# Patient Record
Sex: Female | Born: 1940 | Race: Black or African American | Hispanic: No | Smoking: Current every day smoker
Health system: Southern US, Community
[De-identification: ages and names within clinical notes are randomized; demographics above are authoritative.]

## PROBLEM LIST (undated history)

## (undated) DIAGNOSIS — I10 Essential (primary) hypertension: Secondary | ICD-10-CM

## (undated) DIAGNOSIS — E7800 Pure hypercholesterolemia, unspecified: Secondary | ICD-10-CM

## (undated) DIAGNOSIS — C801 Malignant (primary) neoplasm, unspecified: Secondary | ICD-10-CM

## (undated) DIAGNOSIS — F0390 Unspecified dementia without behavioral disturbance: Secondary | ICD-10-CM

## (undated) HISTORY — PX: ABDOMINAL HYSTERECTOMY: SHX81

## (undated) HISTORY — PX: BREAST SURGERY: SHX581

## (undated) HISTORY — PX: THYROIDECTOMY: SHX17

---

## 2001-04-15 ENCOUNTER — Encounter (INDEPENDENT_AMBULATORY_CARE_PROVIDER_SITE_OTHER): Payer: Self-pay | Admitting: *Deleted

## 2001-04-15 ENCOUNTER — Observation Stay (HOSPITAL_COMMUNITY): Admission: RE | Admit: 2001-04-15 | Discharge: 2001-04-16 | Payer: Self-pay | Admitting: Orthopedic Surgery

## 2002-03-02 ENCOUNTER — Other Ambulatory Visit: Admission: RE | Admit: 2002-03-02 | Discharge: 2002-03-02 | Payer: Self-pay | Admitting: *Deleted

## 2002-04-08 ENCOUNTER — Ambulatory Visit: Admission: RE | Admit: 2002-04-08 | Discharge: 2002-06-18 | Payer: Self-pay | Admitting: Unknown Physician Specialty

## 2004-09-06 ENCOUNTER — Ambulatory Visit: Payer: Self-pay | Admitting: Oncology

## 2005-01-02 ENCOUNTER — Ambulatory Visit: Payer: Self-pay | Admitting: Oncology

## 2005-05-08 ENCOUNTER — Ambulatory Visit: Payer: Self-pay | Admitting: Oncology

## 2005-08-28 ENCOUNTER — Ambulatory Visit: Payer: Self-pay | Admitting: Oncology

## 2006-01-01 ENCOUNTER — Ambulatory Visit: Payer: Self-pay | Admitting: Oncology

## 2006-06-18 ENCOUNTER — Ambulatory Visit: Payer: Self-pay | Admitting: Oncology

## 2007-01-07 ENCOUNTER — Ambulatory Visit: Payer: Self-pay | Admitting: Oncology

## 2010-12-28 NOTE — Op Note (Signed)
[facility]  Patient:    April [name], April [name] Visit Number: [id number] MRN: [id number]          Service Type: SUR Location: 4W [id number] Attending Physician:  Loanne Drilling Proc. Date: 04/15/01 Admit Date:  04/15/2001                             Operative Report  PREOPERATIVE DIAGNOSES:  Left shoulder impingement, acromioclavicular arthrosis, rotator cuff tear, and anterior soft tissue mass left upper arm.  POSTOPERATIVE DIAGNOSES:  Left shoulder impingement, acromioclavicular arthrosis, rotator cuff tear, and anterior soft tissue mass left upper arm.  PROCEDURES: 1. Left shoulder subacromial decompression, distal clavicle resection, and    rotator cuff repair. 2. Excision of an anterior soft tissue mass, left upper arm.  SURGEON:  Ollen Gross, M.D.  ASSISTANT:  Della Goo, P.A.-C.  ANESTHESIA:  General.  ESTIMATED BLOOD LOSS:  Minimal.  DRAINS:  None.  COMPLICATIONS:  None.  CONDITION:  Stable to recovery.  BRIEF CLINICAL NOTE:  April [name] is a 70 year old female with severe left shoulder pain and dysfunction.  MRI demonstrated rotator cuff tear.  She had large spurs.  She presents now for the above-mentioned procedure.  She also has an uncomfortable soft tissue mass in the left upper arm which is to be removed today.  DESCRIPTION OF PROCEDURE:  After the successful administration of intrascalene block, then general anesthetic, the patient is placed sitting upright in the beach chair position, and her left upper extremity and shoulder girdle isolated from her trunk with plastic drapes and prepped and draped in the usual sterile fashion.  Incision is then made, following the skin lines in the mid acromion from posterior to anterior.  Skin is cut with a 10 blade through subcutaneous tissue to a level of deltoid fascia, and circumferential flaps are elevated.  The fascia over the distal clavicle is split longitudinally, and the line is  taken across the anterior aspect of the acromion and the deltoid split in line with its fibers for about 2 cm.  The entire anterior soft tissue sleeve is subperiosteally elevated and posteriorly chest elevated over the distal clavicle.  About 1 cm of distal clavicle is resected with an oscillating saw.  She had a type 2 acromion, and the oscillating saw is then used to create a flat acromial undersurface by removing the spur.  Rotator cuff is visualized after the bursa is removed.  She has a high-grade partial thickness tear at the supraspinatus insertion.  This is taken down, small trough created, and the Mitek anchor passed into the trough.  The sutures are passed through the free edge of the tendon, and it is advanced to the trough and the sutures tied.  The wound is then copiously irrigated with antibiotic solution and the deltoid reattached to the acromion through drill holes.  Fascia over the clavicle is then embrocated with #1 Ethibond and the deltoid splits also closed with the Ethibond.  Subcu is closed with interrupted 2-0 Vicryl.  Subcuticular with running 4-0 Monocryl.  A longitudinal incision is then made over the anterior soft tissue mass about 4 cm in length.  The skin is cut with a 10 blade through subcutaneous tissue, and then the mass is identified and dissected and removed.  It was consistent with a lipoma.  The bleeding stopped with electrocautery and subcutaneous tissue was closed with interrupted 2-0 Vicryl after a thorough irrigation.  The subcuticular  is closed with running 4-0 Monocryl.  Incision is clean and dry and Steri-Strips and a bulky sterile dressing applied.  She is placed into a shoulder immobilizer, awakened, and transported to recovery in stable condition. Attending Physician:  Loanne Drilling DD:  04/15/01 TD:  04/15/01 Job: [id number]

## 2010-12-28 NOTE — H&P (Signed)
[facility]  Patient:    April [name]. Visit Number: [id number] MRN: [id number]          Service Type: Attending:  Trudee Grip, M.D. Dictated by:   Druscilla Brownie. Shela Nevin, P.A.-C   CC:         Ollen Gross, M.D.  Nadine Counts, M.D. in [place], [place]   History and Physical  DATE OF BIRTH:  [birth year].  SCHEDULED DATE OF ADMISSION:  April 15, 2001.  CHIEF COMPLAINT:  Pain in my left shoulder.  PLANNED PROCEDURE:  Decompression and distal clavicle resection with rotator cuff repair and removal of anterior soft tissue mass of the left anterior chest wall.  HISTORY OF PRESENT ILLNESS:   This 70 year old female has been seen by Dr. Despina Hick for continuing progressive problem in turning her left shoulder. She has had problems with this at least for three to four months. She has difficulty lifting, twisting the arm in and out, or reaching for anything. 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 She had been seen by Dr. Harrison Mons who initiated conservative care. He eventually ordered an MRI and a full thickness supraspinatous tear was seen in the left shoulder. The patient has shown limited range of motion due to the ______ shoulder with weakness in rotator cuff testing. She has positive impingement signs. Also noted on the anterior shoulder is a soft tissue mass. Due to the positive findings and the fact that the patient has not responded to conservative care, it was thought she would benefit from surgical intervention and is being scheduled for the procedure.       Dr. Harrison Mons is aware of her medical history.  Recently, the patient has had a bout of bronchitis with resulting pleurisy. She was seen in [facility] emergency room x 2. She is currently on antibiotics and prednisone in decreasing doses.  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 She now takes what sounds like Tegretol 300 mg one q.d. She is also on decreasing doses of prednisone, she is on antibiotics, she is on Univasc, HCTZ, and Prilosec. Other past surgery has been a hysterectomy. She does have hypertension.  ALLERGIES:  ASPIRIN upsets her stomach.  REVIEW OF SYSTEMS:  CNS: No seizures, shoulder paralysis, numbness, double vision, but the patient is on Tegretol or other antiseizure medication (patient was not sure) for prevention of seizures. CARDIOVASCULAR: No chest pain, angina, or orthopnea. RESPIRATORY: The patient is now cleared of inspiratory pain and discomfort secondary to her pleurisy. She has no trouble breathing at this time. No hemoptysis, no shortness of breath. GASTROINTESTINAL: No nausea, vomiting, melena, bloody stools. GENITOURINARY: No discharge, dysuria, hematuria. MUSCULOSKELETAL: Primarily in present illness of the left shoulder.  PHYSICAL EXAMINATION:  GENERAL:  Alert and cooperative, friendly 70 year old black female who is in obvious distress with her left shoulder.  VITAL SIGNS:  Blood pressure 118/72, pulse 90 and regular, respirations 18 and labored.  HEENT:  Normocephalic, PERRLA. EOM intact. Oropharynx is clear.  CHEST:  Clear to auscultation. No rhonchi, no rales, no rubs.  HEART:  Regular rate and rhythm, no murmurs are heard.  ABDOMEN:  Soft, nontender, liver and spleen not felt.  GENITALIA, RECTAL, PELVIC, BREASTS: Not done, not pertinent to present illness.  EXTREMITIES:  Left shoulder as in present illness as  above.  ADMITTING DIAGNOSES: 1. Tear rotator cuff, left shoulder with impingement. 2. Mass left anterior shoulder (soft tissue). 3. Hypertension. 4. Pleurisy (under treatment).  PLAN:  The patient will be admitted for decompression and distal clavicle resection with rotator cuff repair and removal of anterior soft tissue mass  of the left anterior chest wall. Dictated by:   Druscilla Brownie. Shela Nevin, P.A.-C Attending:  Trudee Grip, M.D. DD:  04/08/01 TD:  04/08/01 Job: [id number]

## 2014-03-31 ENCOUNTER — Ambulatory Visit (INDEPENDENT_AMBULATORY_CARE_PROVIDER_SITE_OTHER): Payer: Medicare Other | Admitting: Cardiothoracic Surgery

## 2014-03-31 ENCOUNTER — Encounter: Payer: Self-pay | Admitting: *Deleted

## 2014-03-31 VITALS — BP 114/70 | HR 102 | Temp 98.4°F | Resp 19 | Wt 126.4 lb

## 2014-03-31 DIAGNOSIS — R222 Localized swelling, mass and lump, trunk: Secondary | ICD-10-CM | POA: Diagnosis not present

## 2014-03-31 DIAGNOSIS — R918 Other nonspecific abnormal finding of lung field: Secondary | ICD-10-CM

## 2014-03-31 NOTE — [facility] Oncology Navigator Note (Signed)
Late entry:  03/30/14 - Called patient with information and reminder of appointment for [facility] 03/31/14 with arrival time at 3:30 PM for appointment with Dr. Servando Snare.  Patient verbalized understanding.

## 2014-03-31 NOTE — Progress Notes (Signed)
[address]       [place] [address]             [phone]                    [facility] Medical Record #[id number] Date of Birth: Apr 09, 1941  Referring: Gardiner Rhyme, MD Primary Care: Gala Lewandowsky, MD  Chief Complaint:   Lung Mass  History of Present Illness:    April [name] 73 y.o. female is seen in the office  today for  Left upper lobe lung lesion, mediastinal adenopathy and left ischium lesion on PET. Attempted at bronchoscopy did not reveal a tissue dx. Patient originally presented to the ER with increasing left leg pain. CT of abdominal and pelvis was done suggesting metastatic disease . A PET scan was done      Current Activity/ Functional Status:  Patient is not independent with mobility/ambulation, transfers, ADL's, IADL's.   Zubrod Score: At the time of surgery this patient's most appropriate activity status/level should be described as: []     0    Normal activity, no symptoms []     1    Restricted in physical strenuous activity but ambulatory, able to do out light work []     2    Ambulatory and capable of self care, unable to do work activities, up and about               >50 % of waking hours                              [x]     3    Only limited self care, in bed greater than 50% of waking hours []     4    Completely disabled, no self care, confined to bed or chair []     5    Moribund    past medical history/surgical history  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  History   Social History  . Marital Status: Widowed    Spouse Name: N/A    Number of Children: N/A  . Years of Education: N/A   Occupational History  . Not on file.   Social History Main Topics  . Smoking status: Current Every Day Smoker  . Smokeless  tobacco: Never Used  . Alcohol Use: Not on file  . Drug Use: Not on file  . Sexual Activity: Not on file   Other Topics Concern  . Not on file   Social History Narrative  . No narrative on file    History  Smoking status  . Current Every Day Smoker  Smokeless tobacco  . Never Used    History  Alcohol Use: Not on file     Allergies not on file  No current outpatient prescriptions on file.   No current facility-administered medications for this visit.     Review of Systems:     Cardiac Review of Systems: Y or N  Chest Pain [ n   ]  Resting SOB [ n  ] Exertional SOB  [ y ]  Vertell Limber Florencio.Farrier  ]   Pedal Edema [ y  ]    Palpitations [  ]  Syncope  [n  ]   Presyncope [ n  ]  General Review of Systems: [Y] = yes [  ]=no Constitional: recent weight change [  ];  Wt loss over the last 3 months [   ] anorexia [  ]; fatigue Blue.Reese  ]; nausea [  ]; night sweats [  ]; fever [  ]; or chills [  ];          Dental: poor dentition[  ]; Last Dentist visit:   Eye : blurred vision [  ]; diplopia [   ]; vision changes [  ];  Amaurosis fugax[  ]; Resp: cough [ybruising[  ];  bleeding[  ];  anemia[  ];  Neuro: TIA[  ];  headaches[  ];  stroke[  ];  vertigo[  ];  seizures[  ];   paresthesias[  ];  difficulty walking[y  ];  Psych:depression[  ]; anxiety[  ];  Endocrine: diabetes[  ];  thyroid dysfunction[  ];  Immunizations: Flu up to date Florencio.Farrier  ]; Pneumococcal up to date [ n ];  Other:  Physical Exam: BP 114/70  Pulse 102  Temp(Src) 98.4 F (36.9 C) (Oral)  Resp 19  Wt 126 lb 6 oz (57.323 kg)  PHYSICAL EXAMINATION:  General appearance: alert, appears older than stated age, fatigued and no distress Neurologic: intact Heart: regular rate and rhythm, S1, S2 normal, no murmur, click, rub or gallop Lungs: diminished breath sounds bibasilar Abdomen: soft, non-tender; bowel sounds normal; no masses,  no organomegaly Extremities: extremities normal, atraumatic, no cyanosis or edema, Homans sign is  negative, no sign of DVT and left hip pain   Diagnostic Studies & Laboratory data:     Recent Radiology Findings:  CLINICAL DATA: Initial treatment strategy for lung lesion. History of breast cancer.Marland Kitchen  EXAM: NUCLEAR MEDICINE PET SKULL BASE TO THIGH  TECHNIQUE: 11.2 mCi F-18 FDG was injected intravenously. Full-ring PET imaging was performed from the skull base to thigh after the radiotracer. CT data was obtained and used for attenuation correction and anatomic localization.  FASTING BLOOD GLUCOSE: Value: 115 mg/dl  COMPARISON: CT chest 02/24/2014  FINDINGS: NECK  No hypermetabolic lymph nodes in the neck. Bilateral paraspinal muscle activity is noted. Uptake around the shoulder muscles is also noted.  CHEST  The left upper lobe lung mass is markedly hypermetabolic with SUV max of 48.5. There is also bulky mediastinal and left hilar lymphadenopathy which is also markedly hypermetabolic. SUV max is 61.1. No contralateral mediastinal adenopathy.  Underline emphysematous changes and pulmonary scarring. I do not see any obvious metastatic pulmonary nodules. No axillary adenopathy.  ABDOMEN/PELVIS  No abnormal hypermetabolic activity within the liver, pancreas, adrenal glands, or spleen. No hypermetabolic lymph nodes in the abdomen or pelvis.  SKELETON  There is a large destructive bone lesion involving the posterior aspect of the lower left acetabulum extending into the ischium. This is hypermetabolic and has an SUV max of 27.15.  IMPRESSION: 1. Markedly hypermetabolic left upper lobe lung mass with left-sided hilar and bulky mediastinal lymphadenopathy. No contralateral adenopathy. 2. No evidence of metastatic disease involving the abdomen/pelvis. 3. 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   Electronically Signed By: Kalman Jewels M.D. 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 I have discussed these findings with the patient and her family. Will proceed first with bx of left hip  area  As this will be less invasive on this patient who is frail. If no tissue dx can be obtained will consider Bronch with EBUS poss mediastinoscopy.      I spent 40 minutes counseling the patient face to face. The total time spent in the appointment was 60 minutes.  Grace Isaac MD      [address] [place] [address] Office [phone]   Beeper [phone]  04/03/2014 10:06 PM

## 2014-04-01 ENCOUNTER — Other Ambulatory Visit: Payer: Self-pay

## 2014-04-01 DIAGNOSIS — R2242 Localized swelling, mass and lump, left lower limb: Secondary | ICD-10-CM

## 2014-04-03 ENCOUNTER — Encounter: Payer: Self-pay | Admitting: Cardiothoracic Surgery

## 2014-04-03 DIAGNOSIS — R918 Other nonspecific abnormal finding of lung field: Secondary | ICD-10-CM | POA: Insufficient documentation

## 2014-04-04 ENCOUNTER — Other Ambulatory Visit: Payer: Self-pay | Admitting: Cardiothoracic Surgery

## 2014-04-04 ENCOUNTER — Other Ambulatory Visit: Payer: Self-pay | Admitting: *Deleted

## 2014-04-04 DIAGNOSIS — R222 Localized swelling, mass and lump, trunk: Secondary | ICD-10-CM

## 2014-04-08 ENCOUNTER — Other Ambulatory Visit: Payer: Self-pay | Admitting: *Deleted

## 2014-04-08 DIAGNOSIS — R222 Localized swelling, mass and lump, trunk: Secondary | ICD-10-CM

## 2014-04-12 ENCOUNTER — Other Ambulatory Visit: Payer: Self-pay | Admitting: Radiology

## 2014-04-13 ENCOUNTER — Ambulatory Visit
Admission: RE | Admit: 2014-04-13 | Discharge: 2014-04-13 | Disposition: A | Discharge status: Home / Self Care | Payer: Medicare Other | Source: Ambulatory Visit | Attending: Cardiothoracic Surgery | Admitting: Cardiothoracic Surgery

## 2014-04-13 ENCOUNTER — Other Ambulatory Visit: Payer: Self-pay | Admitting: Radiology

## 2014-04-13 DIAGNOSIS — R222 Localized swelling, mass and lump, trunk: Secondary | ICD-10-CM

## 2014-04-13 MED ORDER — GADOBENATE DIMEGLUMINE 529 MG/ML IV SOLN
10.0000 mL | Freq: Once | INTRAVENOUS | Status: AC | PRN
  Administered 2014-04-13: 10 mL via INTRAVENOUS

## 2014-04-14 ENCOUNTER — Other Ambulatory Visit: Payer: Self-pay | Admitting: Radiology

## 2014-04-14 ENCOUNTER — Ambulatory Visit (HOSPITAL_COMMUNITY): Admission: RE | Admit: 2014-04-14 | Payer: Medicare Other | Source: Ambulatory Visit

## 2014-04-20 ENCOUNTER — Encounter (HOSPITAL_COMMUNITY): Payer: Self-pay | Admitting: Pharmacy Technician

## 2014-04-20 ENCOUNTER — Encounter (HOSPITAL_COMMUNITY): Payer: Self-pay

## 2014-04-20 ENCOUNTER — Ambulatory Visit (HOSPITAL_COMMUNITY)
Admission: RE | Admit: 2014-04-20 | Discharge: 2014-04-20 | Disposition: A | Discharge status: Home / Self Care | Payer: Medicare Other | Source: Ambulatory Visit | Attending: Cardiothoracic Surgery | Admitting: Cardiothoracic Surgery

## 2014-04-20 DIAGNOSIS — C801 Malignant (primary) neoplasm, unspecified: Secondary | ICD-10-CM | POA: Insufficient documentation

## 2014-04-20 DIAGNOSIS — F0390 Unspecified dementia without behavioral disturbance: Secondary | ICD-10-CM | POA: Insufficient documentation

## 2014-04-20 DIAGNOSIS — R2242 Localized swelling, mass and lump, left lower limb: Secondary | ICD-10-CM

## 2014-04-20 DIAGNOSIS — R29898 Other symptoms and signs involving the musculoskeletal system: Secondary | ICD-10-CM | POA: Diagnosis not present

## 2014-04-20 HISTORY — DX: Pure hypercholesterolemia, unspecified: E78.00

## 2014-04-20 HISTORY — DX: Malignant (primary) neoplasm, unspecified: C80.1

## 2014-04-20 HISTORY — DX: Unspecified dementia, unspecified severity, without behavioral disturbance, psychotic disturbance, mood disturbance, and anxiety: F03.90

## 2014-04-20 HISTORY — DX: Essential (primary) hypertension: I10

## 2014-04-20 LAB — CBC
HEMATOCRIT: 39.6 % (ref 36.0–46.0)
Hemoglobin: 13 g/dL (ref 12.0–15.0)
MCH: 29.1 pg (ref 26.0–34.0)
MCHC: 32.8 g/dL (ref 30.0–36.0)
MCV: 88.8 fL (ref 78.0–100.0)
Platelets: 494 10*3/uL — ABNORMAL HIGH (ref 150–400)
RBC: 4.46 MIL/uL (ref 3.87–5.11)
RDW: 13.5 % (ref 11.5–15.5)
WBC: 10.5 10*3/uL (ref 4.0–10.5)

## 2014-04-20 LAB — APTT: APTT: 32 s (ref 24–37)

## 2014-04-20 LAB — PROTIME-INR
INR: 1.18 (ref 0.00–1.49)
PROTHROMBIN TIME: 15 s (ref 11.6–15.2)

## 2014-04-20 MED ORDER — MIDAZOLAM HCL 2 MG/2ML IJ SOLN
INTRAMUSCULAR | Status: AC | PRN
  Administered 2014-04-20: 1 mg via INTRAVENOUS

## 2014-04-20 MED ORDER — FENTANYL CITRATE 0.05 MG/ML IJ SOLN
INTRAMUSCULAR | Status: AC | PRN
  Administered 2014-04-20: 50 ug via INTRAVENOUS

## 2014-04-20 MED ORDER — LIDOCAINE HCL 1 % IJ SOLN
INTRAMUSCULAR | Status: AC
  Filled 2014-04-20: qty 10

## 2014-04-20 MED ORDER — FENTANYL CITRATE 0.05 MG/ML IJ SOLN
INTRAMUSCULAR | Status: AC
  Filled 2014-04-20: qty 4

## 2014-04-20 MED ORDER — SODIUM CHLORIDE 0.9 % IV SOLN: INTRAVENOUS | Status: DC

## 2014-04-20 MED ORDER — MIDAZOLAM HCL 2 MG/2ML IJ SOLN
INTRAMUSCULAR | Status: AC
  Filled 2014-04-20: qty 4

## 2014-04-20 MED ORDER — HYDROCODONE-ACETAMINOPHEN 5-325 MG PO TABS
1.0000 | ORAL_TABLET | ORAL | Status: DC | PRN
  Filled 2014-04-20: qty 2

## 2014-04-20 NOTE — Procedures (Signed)
CT guided biopsy of left ischial expansile and destructive lesion.  Aspirates and core biopsies obtained.  Minimal core material obtained.  No immediate complication.

## 2014-04-20 NOTE — H&P (Signed)
April [name] is an 73 y.o. female.   Chief Complaint: Pt with hx Breast Ca Referred to Dr Servando Snare for evaluation of left lung mass CT also reveals mediastinal/hilar lymphadenopathy; bony mets Bronchoscopy was non diagnostic Pt now scheduled for L ischial lesion biopsy  HPI: breast ca; HTN; HLD  Past Medical History  Diagnosis Date  . Cancer     breast  . Dementia   . Hypertension   . High cholesterol     Past Surgical History  Procedure Laterality Date  . Breast surgery    . Abdominal hysterectomy    . Thyroidectomy      No family history on file. Social History:  reports that she has been smoking.  She has never used smokeless tobacco. Her alcohol and drug histories are not on file.  Allergies:  Allergies  Allergen Reactions  . Aspirin Nausea Only     (Not in a hospital admission)  No results found for this or any previous visit (from the past 48 hour(s)). No results found.  Review of Systems  Constitutional: Positive for weight loss. Negative for fever.  Respiratory: Negative for shortness of breath.   Cardiovascular: Negative for chest pain.  Gastrointestinal: Negative for vomiting and abdominal pain.  Musculoskeletal: Positive for back pain and joint pain.  Neurological: Positive for weakness. Negative for dizziness.  Psychiatric/Behavioral: Positive for substance abuse.       Smoker    Blood pressure 113/85, pulse 103, temperature 98.6 F (37 C), temperature source Oral, resp. rate 18, height 5\' 4"  (1.626 m), weight 56.7 kg (125 lb), SpO2 98.00%. Physical Exam  Constitutional: She is oriented to person, place, and time.  Cardiovascular: Normal rate and regular rhythm.   No murmur heard. Respiratory: Effort normal and breath sounds normal. She has no wheezes.  GI: Soft. Bowel sounds are normal. There is no tenderness.  Musculoskeletal: Normal range of motion.  Gait slow  Neurological: She is alert and oriented to person, place, and time.  Skin: Skin  is warm and dry.  Psychiatric: She has a normal mood and affect. Her behavior is normal. Judgment and thought content normal.     Assessment/Plan Hx breast ca New L lung lesion; LAN and bony mets Bronch non diag Now scheduled for L ischial lesion bx Pt and son aware of procedure benefits and risks and agreeable to proceed Consent signed andin chart  Juancarlos Crescenzo A 04/20/2014, 10:08 AM

## 2014-04-20 NOTE — Discharge Instructions (Signed)
Biopsy Care After These instructions give you information on caring for yourself after your procedure. Your doctor may also give you more specific instructions. Call your doctor if you have any problems or questions after your procedure. HOME CARE   Return to your normal diet and activities as told by your doctor.  Change your bandages (dressings) as told by your doctor. If skin glue (adhesive) was used, it will peel off in 7 days.  Only take medicines as told by your doctor.  Ask your doctor when you can bathe and get your wound wet. GET HELP RIGHT AWAY IF:  You see more than a small spot of blood coming from the wound.  You have redness, puffiness (swelling), or pain.  You see yellowish-white fluid (pus) coming from the wound.  You have a fever.  You notice a bad smell coming from the wound or bandage.  You have a rash, trouble breathing, or any allergy problems. MAKE SURE YOU:   Understand these instructions.  Will watch your condition.  Will get help right away if you are not doing well or get worse. Document Released: 04/02/2011 Document Revised: 10/21/2011 Document Reviewed: 04/02/2011 ExitCare Patient Information 2015 [employer]. This information is not intended to replace advice given to you by your health care provider. Make sure you discuss any questions you have with your health care provider.  

## 2014-04-21 ENCOUNTER — Ambulatory Visit: Payer: Medicare Other | Admitting: Cardiothoracic Surgery

## 2014-04-25 ENCOUNTER — Encounter: Payer: Self-pay | Admitting: *Deleted

## 2014-04-25 NOTE — [facility] Oncology Navigator Note (Unsigned)
Spoke with patient today regarding lung cancer diagnosis per Dr. Servando Snare today.  Dr. Servando Snare request a medical oncology appointment.  Pt has been seen in the past in [place] by Dr. Hinton Rao.  Patient would like to go back to see her.  I called Dr. Remi Deter office to arrange appt.  Barbara from office will call patient with appointment.  I will fax last notes and scans to their office.

## 2014-04-25 NOTE — [facility] Oncology Navigator Note (Unsigned)
Faxed notes, scans, labs, and cytology report to Dr. Remi Deter office

## 2014-04-28 ENCOUNTER — Ambulatory Visit: Payer: Medicare Other | Admitting: Cardiothoracic Surgery

## 2014-04-28 ENCOUNTER — Telehealth: Payer: Self-pay

## 2014-04-28 NOTE — Telephone Encounter (Signed)
Patient and family requested that Dr Hinton Rao cancel appt with Dr Servando Snare for 04/28/14. She is not considering surgery at this time. Dr Hinton Rao will fax over her office note for Dr Servando Snare to review. Patient is aware of diagnosis.

## 2014-05-05 ENCOUNTER — Other Ambulatory Visit (HOSPITAL_COMMUNITY)
Admission: RE | Admit: 2014-05-05 | Discharge: 2014-05-05 | Disposition: A | Discharge status: Home / Self Care | Payer: Medicare Other | Source: Ambulatory Visit | Attending: Oncology | Admitting: Oncology

## 2014-05-05 DIAGNOSIS — C414 Malignant neoplasm of pelvic bones, sacrum and coccyx: Secondary | ICD-10-CM | POA: Insufficient documentation

## 2014-05-11 ENCOUNTER — Encounter (HOSPITAL_COMMUNITY): Payer: Self-pay

## 2014-11-11 DEATH — deceased

## 2015-10-18 IMAGING — CT CT BIOPSY
1 of 3 series · 14 of 32 positions shown, 19 images · non-contrast
Comparison: none

CLINICAL DATA: 73-year-old female with a lesion in the left upper
lobe/mediastinum. Expansile and lucent lesion involving the left
ischium. Findings are concerning for metastatic disease. A tissue
diagnosis is needed.

[Series 2: i-spiral 5.0 b40f · axial · 0.75mm/px · z∈[+800,+999]mm · 14 of 67 slices shown, 19 images]
[im 5/67  soft-tissue]
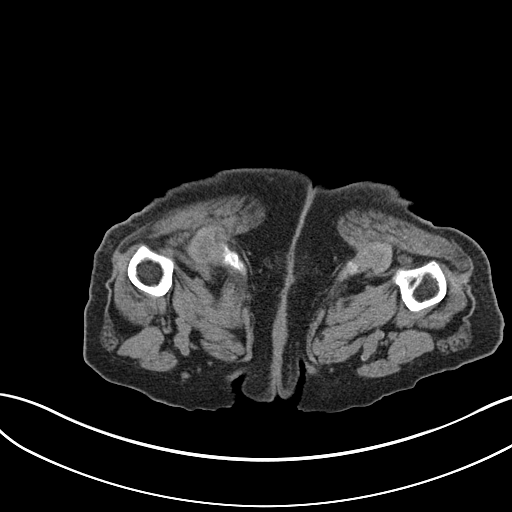
[im 5/67  bone]
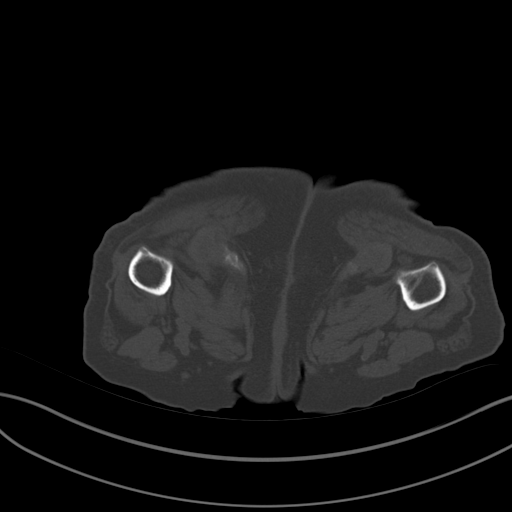
[im 9/67  soft-tissue]
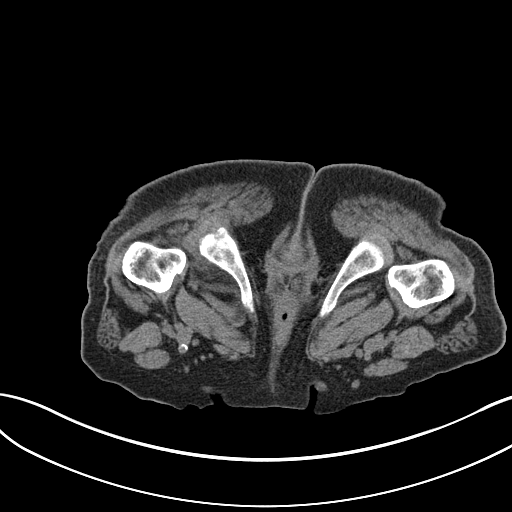
[im 13/67  soft-tissue]
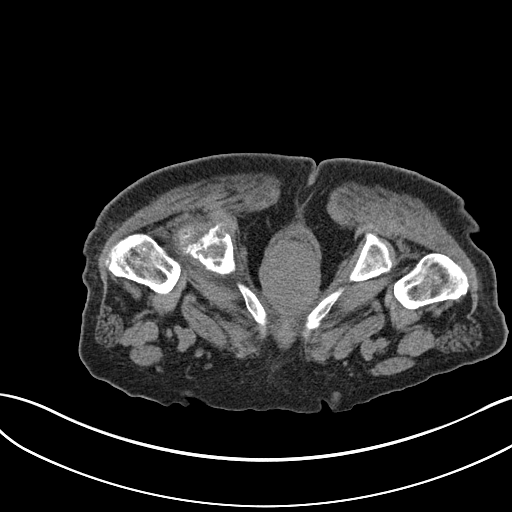
[im 21/67  soft-tissue]
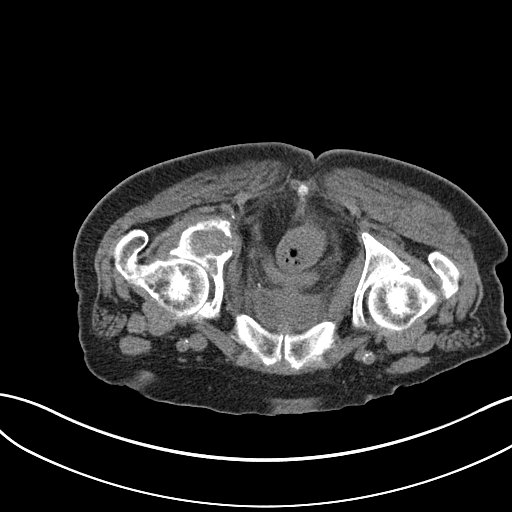
[im 25/67  soft-tissue]
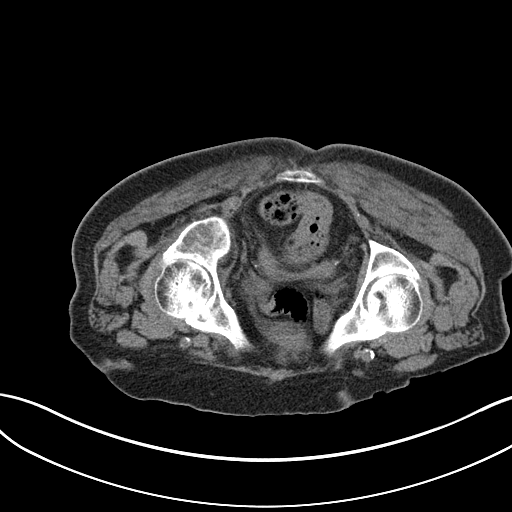
[im 29/67  soft-tissue]
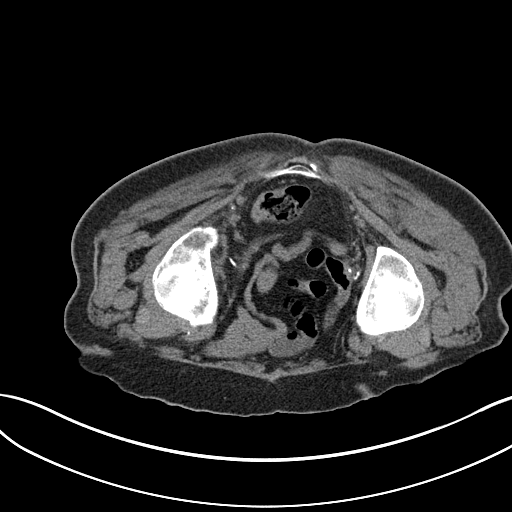
[im 34/67  soft-tissue]
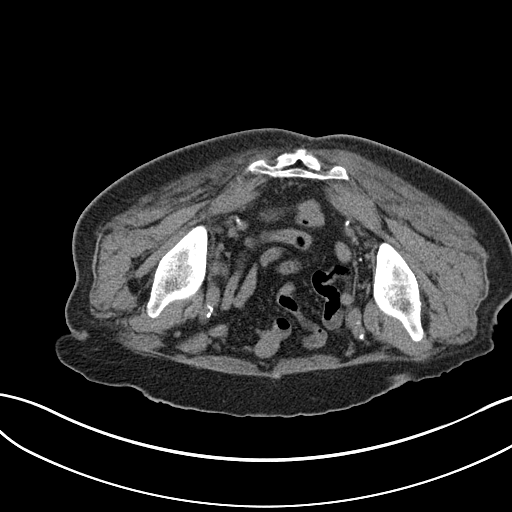
[im 38/67  soft-tissue]
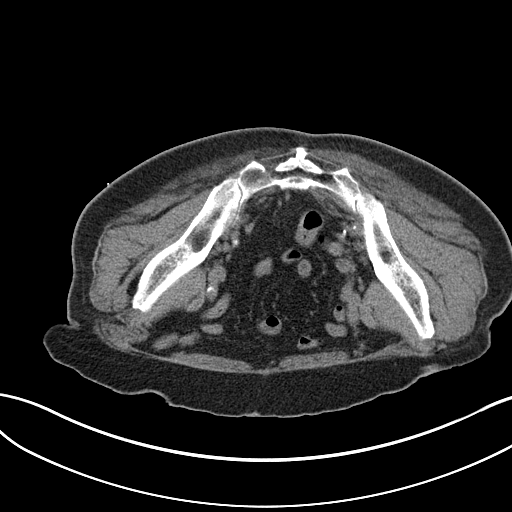
[im 42/67  soft-tissue]
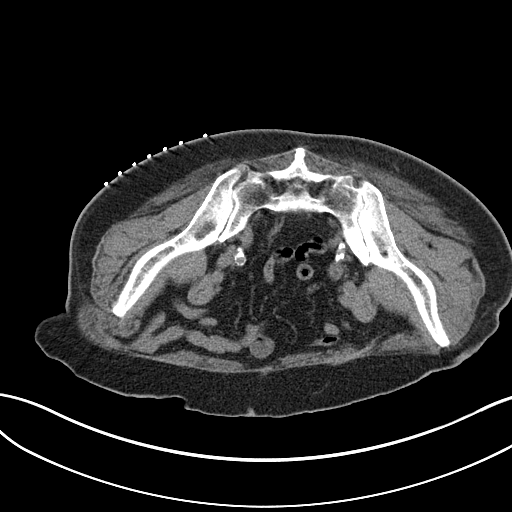
[im 42/67  bone]
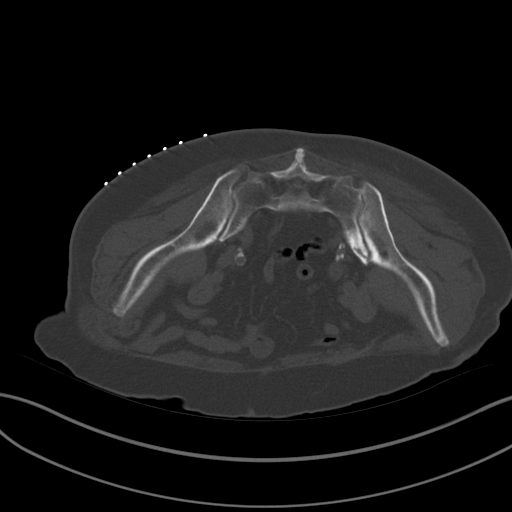
[im 46/67  soft-tissue]
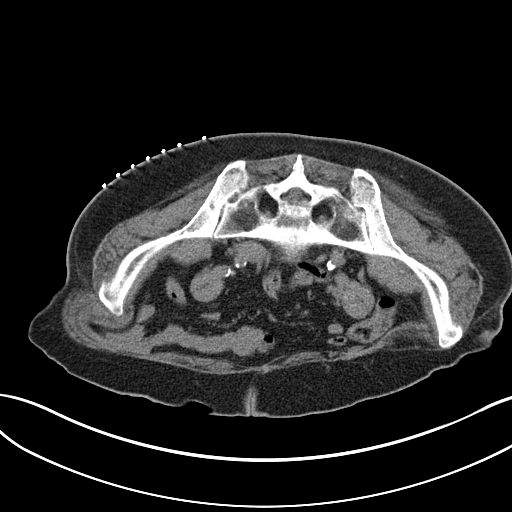
[im 50/67  lung]
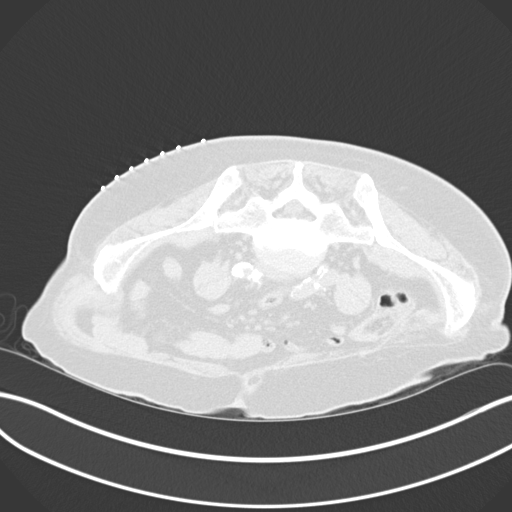
[im 54/67  soft-tissue]
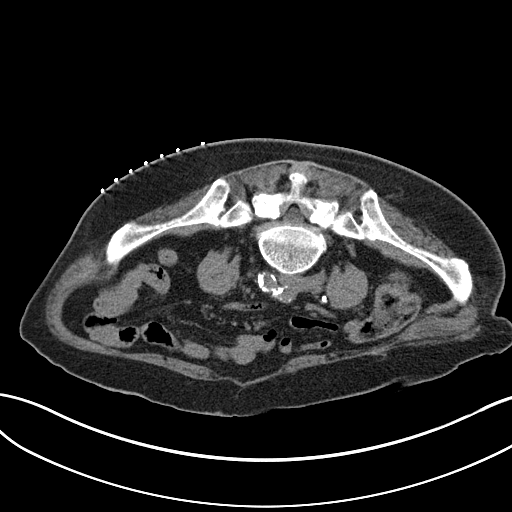
[im 54/67  lung]
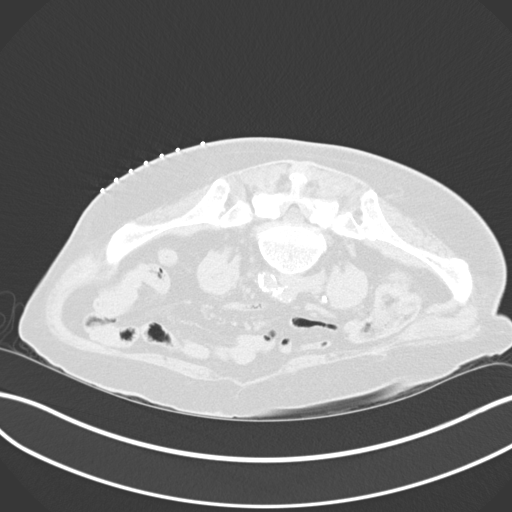
[im 58/67  soft-tissue]
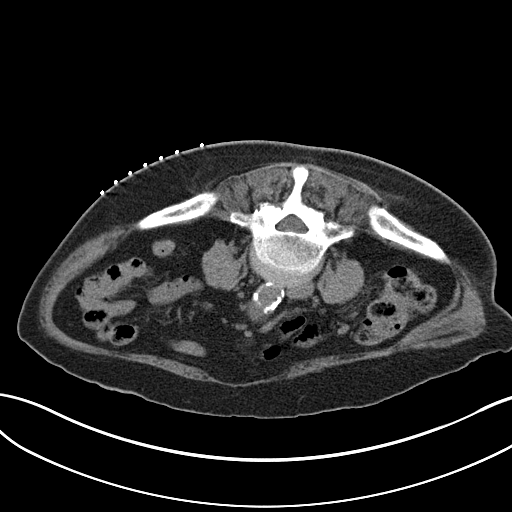
[im 58/67  lung]
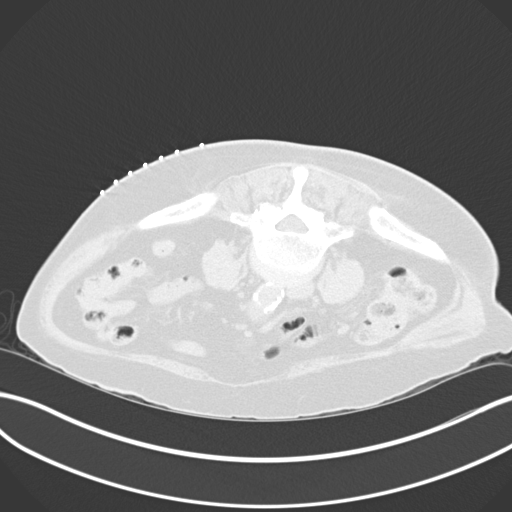
[im 62/67  soft-tissue]
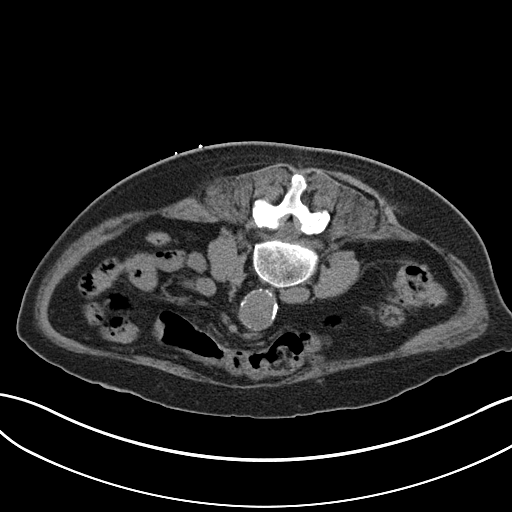
[im 62/67  lung]
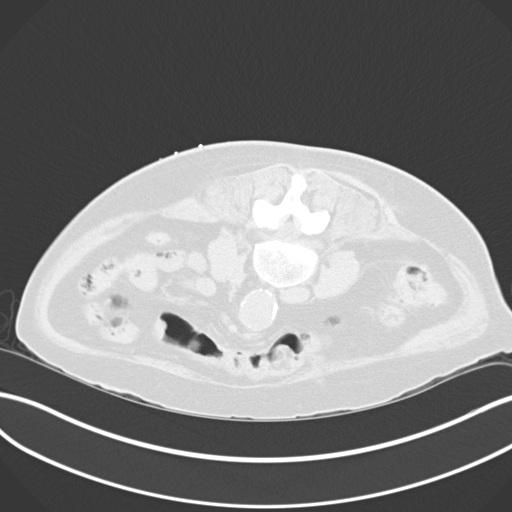

[14 of 32 positions shown; findings below may reference images not displayed]

EXAM:
CT GUIDED BIOPSY OF THE LEFT ISCHIAL BONE LESION

MEDICATIONS:
1 mg versed, 100 mcg fentanyl. A radiology nurse monitored the
patient for moderate sedation.

ANESTHESIA/SEDATION:
Sedation time: 28 min

PROCEDURE:
Informed consent was obtained for a bone biopsy. The patient was
placed prone in CT scanner. Images through the pelvis were obtained.
The left side of the pelvis was prepped with Betadine and a sterile
field was created. Skin and soft tissues were anesthetized with 1%
lidocaine. 11 gauge bone needle was directed into the expansile left
ischial bone lesion. Needle was easily advanced into the lucent
lesion. Core biopsies attempted with the coaxial bone core device
but minimal core tissue was obtained. As result, bloody fluid was
aspirated through 11 gauge bone needle and collected for cytology.
The bone needle was removed. Subsequently, a 17 gauge needle was
directed into the lucent bone lesion and additional core biopsies
were obtained with an 18 gauge device. 17 gauge needle was removed
without complication. A bandage was placed at the puncture site.
FINDINGS: Expansile and destructive lucent lesion involving the left ischium.
Needle position confirmed within the lesion.

COMPLICATIONS:
None
IMPRESSION: CT-guided aspirations and core biopsies of the left ischial bone
lesion.
